# Patient Record
Sex: Male | Born: 1995 | Race: White | Hispanic: No | Smoking: Never smoker
Health system: Southern US, Community
[De-identification: ages and names within clinical notes are randomized; demographics above are authoritative.]

---

## 2014-07-18 ENCOUNTER — Emergency Department: Payer: Self-pay | Admitting: Emergency Medicine

## 2014-08-15 ENCOUNTER — Emergency Department (HOSPITAL_COMMUNITY): Payer: BLUE CROSS/BLUE SHIELD

## 2014-08-15 ENCOUNTER — Encounter (HOSPITAL_COMMUNITY): Payer: Self-pay | Admitting: *Deleted

## 2014-08-15 ENCOUNTER — Emergency Department (HOSPITAL_COMMUNITY)
Admission: EM | Admit: 2014-08-15 | Discharge: 2014-08-16 | Disposition: A | Discharge status: Home / Self Care | Payer: BLUE CROSS/BLUE SHIELD | Attending: Emergency Medicine | Admitting: Emergency Medicine

## 2014-08-15 DIAGNOSIS — N503 Cyst of epididymis: Secondary | ICD-10-CM | POA: Diagnosis not present

## 2014-08-15 DIAGNOSIS — N508 Other specified disorders of male genital organs: Secondary | ICD-10-CM | POA: Diagnosis present

## 2014-08-15 DIAGNOSIS — N50819 Testicular pain, unspecified: Secondary | ICD-10-CM

## 2014-08-15 MED ORDER — IBUPROFEN 400 MG PO TABS
600.0000 mg | ORAL_TABLET | Freq: Once | ORAL | Status: DC
  Filled 2014-08-15 (×2): qty 1

## 2014-08-15 NOTE — ED Notes (Signed)
Pt in c/o right testicle pain, states he has been having intermittent pain for the last week and it will alternate between left and right testicle, today pain is worse on the right and he has noticed redness and swelling

## 2014-08-16 LAB — URINALYSIS, ROUTINE W REFLEX MICROSCOPIC
BILIRUBIN URINE: NEGATIVE
Glucose, UA: NEGATIVE mg/dL
HGB URINE DIPSTICK: NEGATIVE
Ketones, ur: NEGATIVE mg/dL
Leukocytes, UA: NEGATIVE
NITRITE: NEGATIVE
PH: 7.5 (ref 5.0–8.0)
Protein, ur: NEGATIVE mg/dL
SPECIFIC GRAVITY, URINE: 1.008 (ref 1.005–1.030)
Urobilinogen, UA: 1 mg/dL (ref 0.0–1.0)

## 2014-08-16 LAB — GRAM STAIN: GRAM STAIN: NONE SEEN

## 2014-08-16 NOTE — ED Provider Notes (Signed)
CSN: [id number]     Arrival date & time 08/15/14  2142 History   First MD Initiated Contact with Patient 08/15/14 2233     Chief Complaint  Patient presents with  . Testicle Pain     (Consider location/radiation/quality/duration/timing/severity/associated sxs/prior Treatment) HPI   This is a 19 yo male with no pertinent PMH, presenting with testicular pain.  Onset one month ago.  Located bilateral testes, alternate bw left and right.  Intermittent.  Throbbing.  Alleviated but not resolved with APAP.  Radiates up the scrotum bilaterally.  Negative for dysuria, penile pain, urethral discharge, swelling to the penis.  Thinks he might have seen a "blood vessel" weeks ago.  He presented to [facility] a couple of weeks ago, ultrasound was performed, but patient was not told of the results.  History reviewed. No pertinent past medical history. History reviewed. No pertinent past surgical history. History reviewed. No pertinent family history. History  Substance Use Topics  . Smoking status: Never Smoker   . Smokeless tobacco: Not on file  . Alcohol Use: Not on file    Review of Systems  Constitutional: Negative for fever and chills.  HENT: Negative for facial swelling.   Eyes: Negative for pain and visual disturbance.  Respiratory: Negative for chest tightness and shortness of breath.   Cardiovascular: Negative for chest pain.  Gastrointestinal: Negative for nausea and vomiting.  Genitourinary: Positive for testicular pain. Negative for dysuria.  Musculoskeletal: Negative for myalgias and arthralgias.  Neurological: Negative for headaches.  Psychiatric/Behavioral: Negative for behavioral problems.      Allergies  Review of patient's allergies indicates no known allergies.  Home Medications   Prior to Admission medications   Medication Sig Start Date End Date Taking? Authorizing Provider  acetaminophen (TYLENOL) 325 MG tablet Take 650 mg by mouth every 6 (six) hours as needed for  mild pain.   Yes Historical Provider, MD   BP 135/82 mmHg  Pulse 97  Temp(Src) 98.2 F (36.8 C) (Oral)  Resp 13  Ht  (1.753 m)  Wt 150 lb (68.04 kg)  BMI 22.14 kg/m2  SpO2 100% Physical Exam  Constitutional: He is oriented to person, place, and time. He appears well-developed and well-nourished. No distress.  HENT:  Head: Normocephalic and atraumatic.  Mouth/Throat: No oropharyngeal exudate.  Eyes: Conjunctivae are normal. Pupils are equal, round, and reactive to light. No scleral icterus.  Neck: Normal range of motion. No tracheal deviation present. No thyromegaly present.  Cardiovascular: Normal rate, regular rhythm and normal heart sounds.  Exam reveals no gallop and no friction rub.   No murmur heard. Pulmonary/Chest: Effort normal and breath sounds normal. No stridor. No respiratory distress. He has no wheezes. He has no rales. He exhibits no tenderness.  Abdominal: Soft. He exhibits no distension and no mass. There is no tenderness. There is no rebound and no guarding. Hernia confirmed negative in the right inguinal area and confirmed negative in the left inguinal area.  Genitourinary: Testes normal and penis normal. Right testis shows no mass, no swelling and no tenderness. Right testis is descended. Cremasteric reflex is not absent on the right side. Left testis shows no mass, no swelling and no tenderness. Left testis is descended. Cremasteric reflex is not absent on the left side.  Musculoskeletal: Normal range of motion. He exhibits no edema.  Lymphadenopathy:       Right: No inguinal adenopathy present.       Left: No inguinal adenopathy present.  Neurological: He is alert  and oriented to person, place, and time.  Skin: Skin is warm and dry. He is not diaphoretic.    ED Course  Procedures (including critical care time) Labs Review Labs Reviewed  GRAM STAIN  URINALYSIS, ROUTINE W REFLEX MICROSCOPIC    Imaging Review US Scrotum  08/16/2014   CLINICAL DATA:   Bilateral testicle pain  EXAM: SCROTAL ULTRASOUND  DOPPLER ULTRASOUND OF THE TESTICLES  TECHNIQUE: Complete ultrasound examination of the testicles, epididymis, and other scrotal structures was performed. Color and spectral Doppler ultrasound were also utilized to evaluate blood flow to the testicles.  COMPARISON:  07/18/2014  FINDINGS: Right testicle  Measurements: 4.6 x 2.2 x 3 cm. No mass or microlithiasis visualized.  Left testicle  Measurements: 4.7 x 2.4 x 2.5 cm. No mass or microlithiasis visualized.  Right epididymis:  Normal in size and appearance.  Left epididymis: Incidental 3 mm cysts in the head. Normal size and vascularity.  Hydrocele:  None visualized.  Varicocele:  Borderline left varicocele.  Pulsed Doppler interrogation of both testes demonstrates normal low resistance arterial and venous waveforms bilaterally.  IMPRESSION: 1. No torsion or other explanation for acute pain. 2. Small, incidental left epididymal cysts.   Electronically Signed   By: Marnee Spring M.D.   On: 08/16/2014 00:05   [place] Art/ven Flow Abd Pelv Doppler  08/16/2014   CLINICAL DATA:  Bilateral testicle pain  EXAM: SCROTAL ULTRASOUND  DOPPLER ULTRASOUND OF THE TESTICLES  TECHNIQUE: Complete ultrasound examination of the testicles, epididymis, and other scrotal structures was performed. Color and spectral Doppler ultrasound were also utilized to evaluate blood flow to the testicles.  COMPARISON:  07/18/2014  FINDINGS: Right testicle  Measurements: 4.6 x 2.2 x 3 cm. No mass or microlithiasis visualized.  Left testicle  Measurements: 4.7 x 2.4 x 2.5 cm. No mass or microlithiasis visualized.  Right epididymis:  Normal in size and appearance.  Left epididymis: Incidental 3 mm cysts in the head. Normal size and vascularity.  Hydrocele:  None visualized.  Varicocele:  Borderline left varicocele.  Pulsed Doppler interrogation of both testes demonstrates normal low resistance arterial and venous waveforms bilaterally.  IMPRESSION: 1.  No torsion or other explanation for acute pain. 2. Small, incidental left epididymal cysts.   Electronically Signed   By: Marnee Spring M.D.   On: 08/16/2014 00:05     EKG Interpretation None      MDM   Final diagnoses:  Epididymal cyst    This is a 19 yo male with no pertinent PMH, presenting with testicular pain.  Onset one month ago.  Located bilateral testes, alternate bw left and right.  Intermittent.  Throbbing.  Alleviated but not resolved with APAP.  Radiates up the scrotum bilaterally.  Negative for dysuria, penile pain, urethral discharge, swelling to the penis.  Thinks he might have seen a "blood vessel" weeks ago.  He presented to [facility] a couple of weeks ago, ultrasound was performed, but patient was not told of the results.  Vitals WNL.  Abdominal exam reveals no TTP, no rebound, no rigidity, and no guarding.  GU exam reveals normal circumcised penis, normal testes, without erythema, swelling, high-riding testicle, TTP.  Cremasteric is intact bilaterally.  Ultrasound is WNL with the exception of an epididymal cyst.  I do not suspect torsion, epididymitis, orchitis, hydrocele, varicocele.  No trauma is reported.  Pt stable for discharge, FU with urology.  All questions answered.  Return precautions given, particularly if pain becomes acutely worse, testes are swollen, high-riding.  He expresses understanding.  I have discussed case and care has been guided by my attending physician, Dr. Jodi MourningZavitz.     Loma BostonStirling Roshawnda Pecora, MD 08/16/14 1023  Blane OharaJoshua Zavitz, MD 08/18/14 1051

## 2014-08-16 NOTE — Discharge Instructions (Signed)
YOU MAY PURCHASE A SCROTAL SUPPORT, APPLY HEAT AND ICE, AS WELL AS USE IBUPROFEN SCHEDULED.  FOLLOW UP WITH UROLOGY AS MENTIONED IN THE FOLLOW UP SECTION.   Scrotal Masses Scrotal swelling is common in men of all ages. Common types of testicular masses include:   Hydrocele. The most common benign testicular mass in an adult. Hydroceles are generally soft and painless collections of fluid in the scrotal sac. These can rapidly change size as the fluid enters or leaves. Hydroceles can be associated with an underlying cancer of the testicle.  Spermatoceles. Generally soft and painless cyst-like masses in the scrotum that contain fluid, usually above the testicle. They can rapidly change size as the fluid enters or leaves. They are more prominent while standing or exercising. Sometimes, spermatoceles may cause a sensation of heaviness or a dull ache.  Orchitis. Inflammation of the testicle. It is painful and may be associated with a fever or symptoms of a urinary tract infection, including frequent and painful urination. It is common in males who have the mumps.  Varicocele. An enlargement of the veins that drain the testicles. Varicoceles usually occur on the left side of the scrotum. This condition can increase the risk of infertility. Varicocele is sometimes more prominent while standing or exercising. Sometimes, varicoceles may cause a sensation of heaviness or a dull ache.  Inguinal hernia. A bulge caused by a portion of intestine protruding into the scrotum through a weak area in the abdominal muscles. Hernias may or may not be painful. They are soft and usually enlarge with coughing or straining.  Torsion of the testis. This can cause a testicular mass that develops quickly and is associated with tenderness or fever, or both. It is caused by a twisting of the testicle within the sac. It also reduces the blood supply and can destroy the testis if not treated quickly with surgery.  Epididymitis.  Inflammation of the epididymis (a structure attached above and behind the testicle), usually caused by a urinary tract infection or a sexually transmitted infection. This generally shows up as testicular discomfort and swelling and may include pain during urination. It is frequently associated with a testicle infection.  Testicular appendages. Remnants of tissue on the testis present since birth. A testicular appendage can twist on its blood supply and cause pain. In most cases, this is seen as a blue dot on the scrotum.  Hematocele. A collection of blood between the layers of the sac inside the scrotum. It usually is caused by trauma to the scrotum.  Sebaceous cysts. These can be a swelling in the skin of the scrotum and are usually painless.  Cancer (carcinoma) of the skin of the scrotum. It can cause scrotal swelling, but this is rare. Document Released: 10/26/2002 Document Revised: 12/22/2012 Document Reviewed: 10/11/2012 [facility] Patient Information 2015 [employer], [place]. This information is not intended to replace advice given to you by your health care provider. Make sure you discuss any questions you have with your health care provider.

## 2016-01-27 IMAGING — US US SCROTUM W/ DOPPLER COMPLETE
1 series · 14 of 25 positions shown · non-contrast
Comparison: None.

CLINICAL DATA: Acute onset of left testicular pain for 1 day.
Pressure about the left testis. Initial encounter.

EXAM:
SCROTAL ULTRASOUND
DOPPLER ULTRASOUND OF THE TESTICLES
TECHNIQUE: Complete ultrasound examination of the testicles, epididymis, and
other scrotal structures was performed. Color and spectral Doppler
ultrasound were also utilized to evaluate blood flow to the
testicles.

[Series 1: us scrotum w/ doppler complete · 0.06mm/px · 14 of 56 slices shown]
[im 1/56]
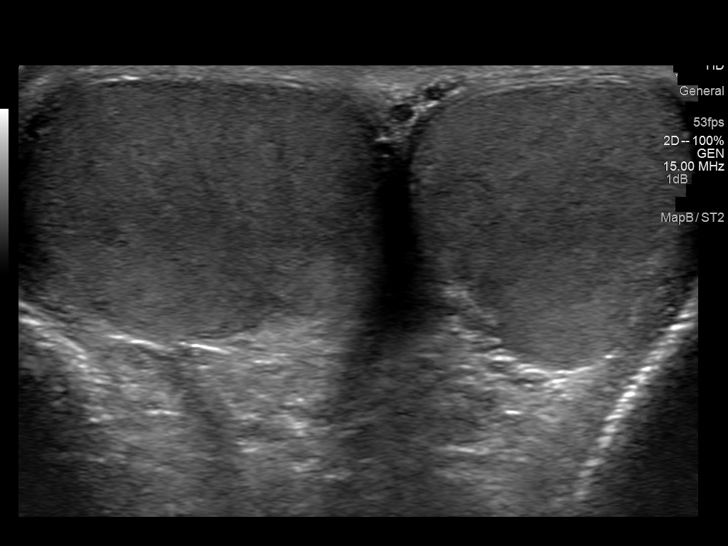
[im 5/56]
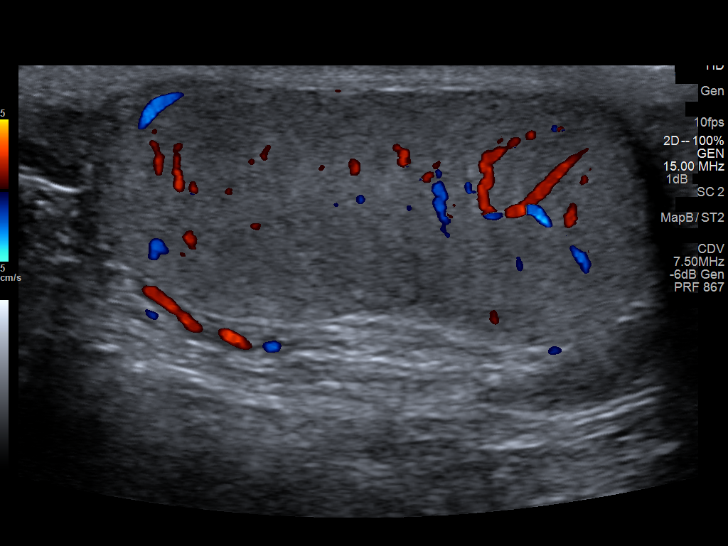
[im 10/56]
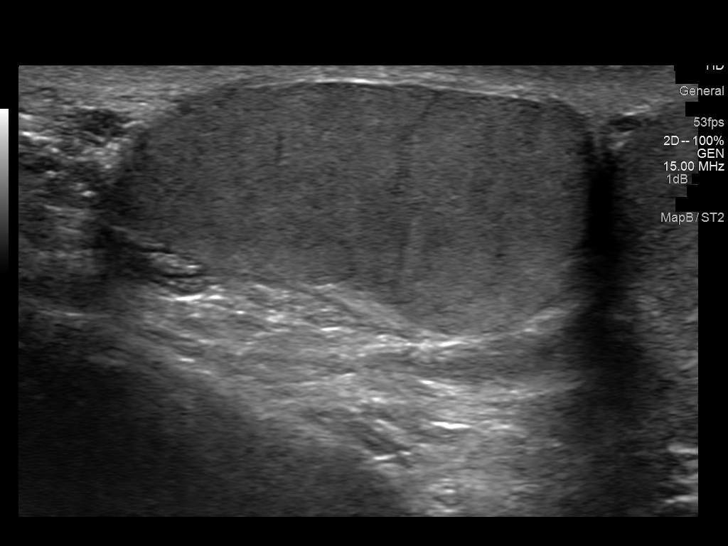
[im 14/56]
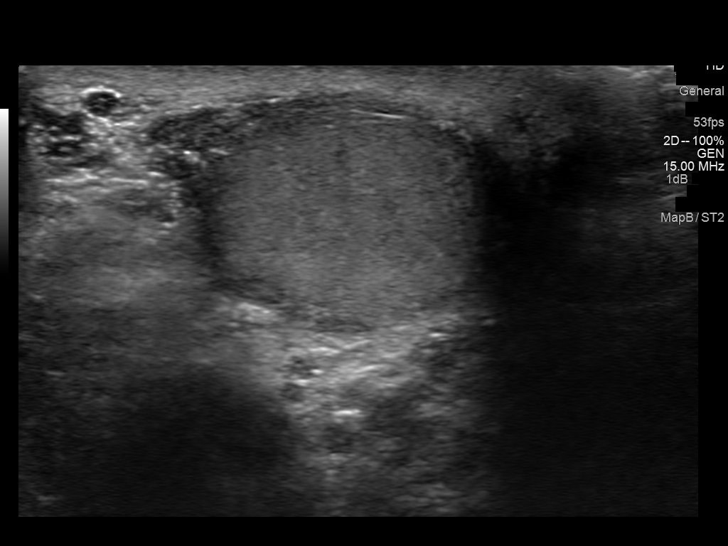
[im 19/56]
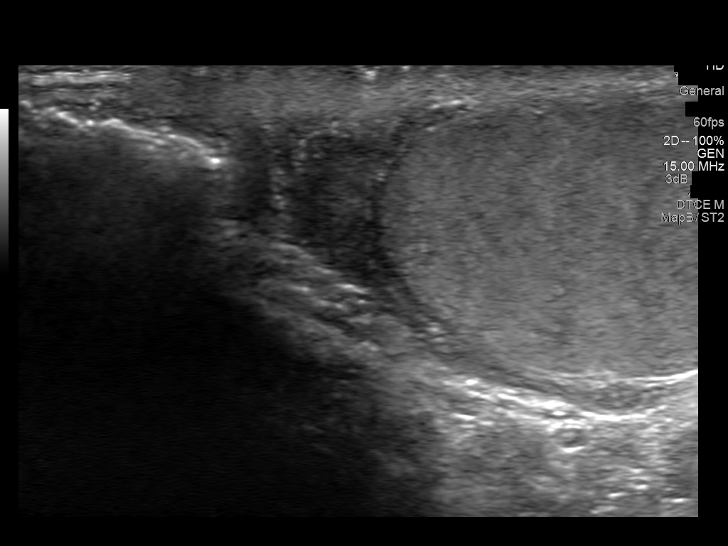
[im 21/56]
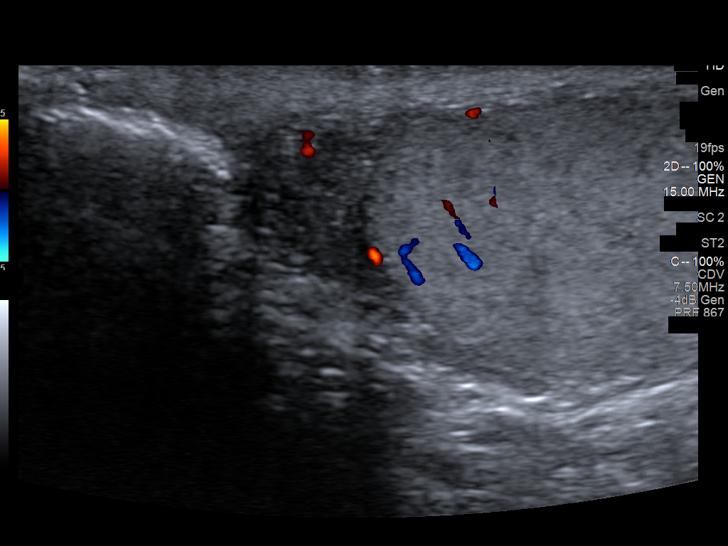
[im 26/56]
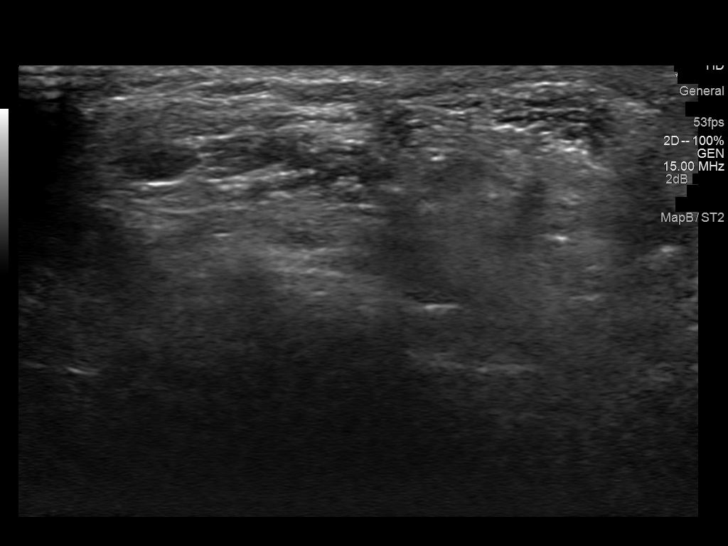
[im 30/56]
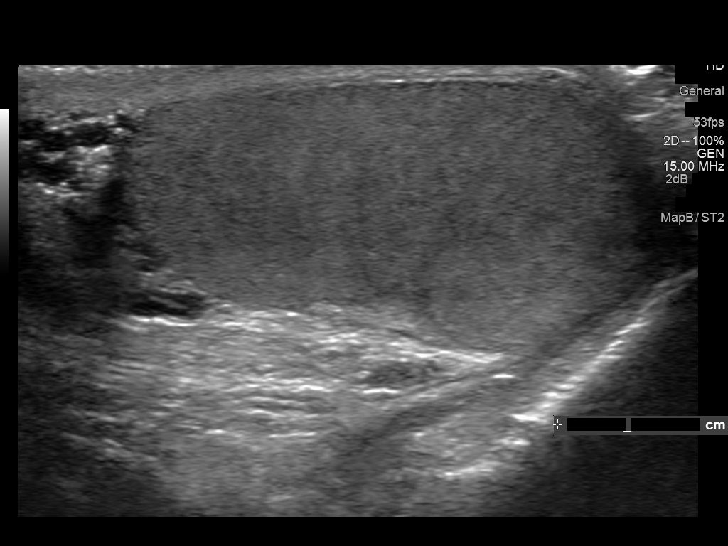
[im 35/56]
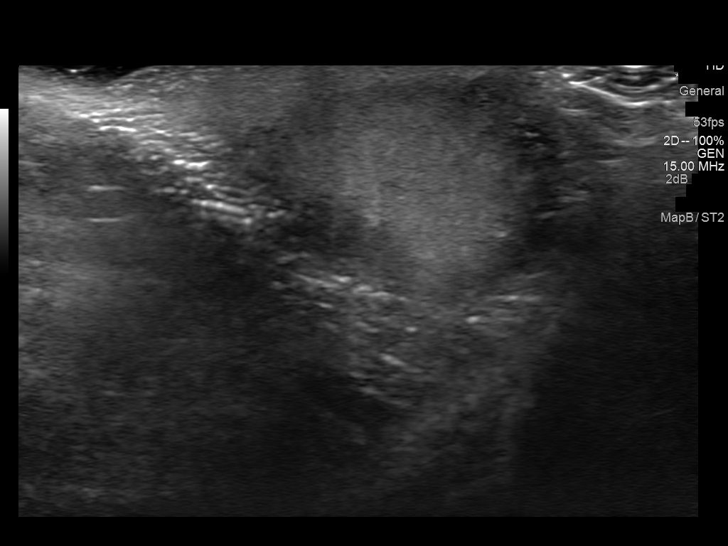
[im 37/56]
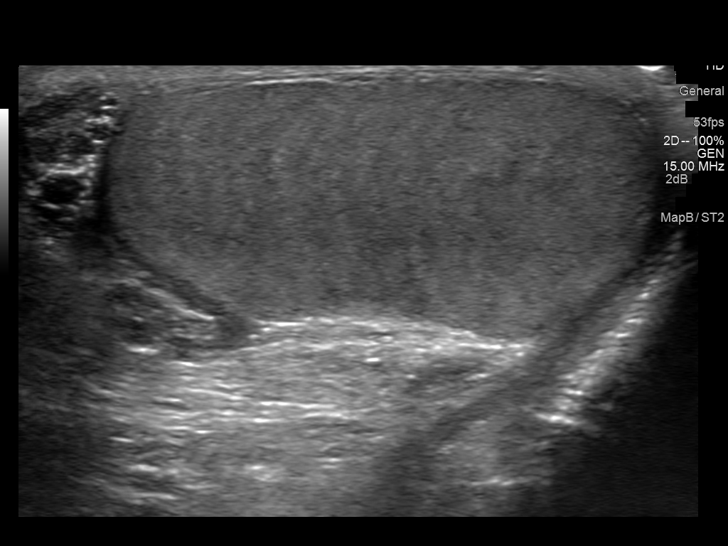
[im 42/56]
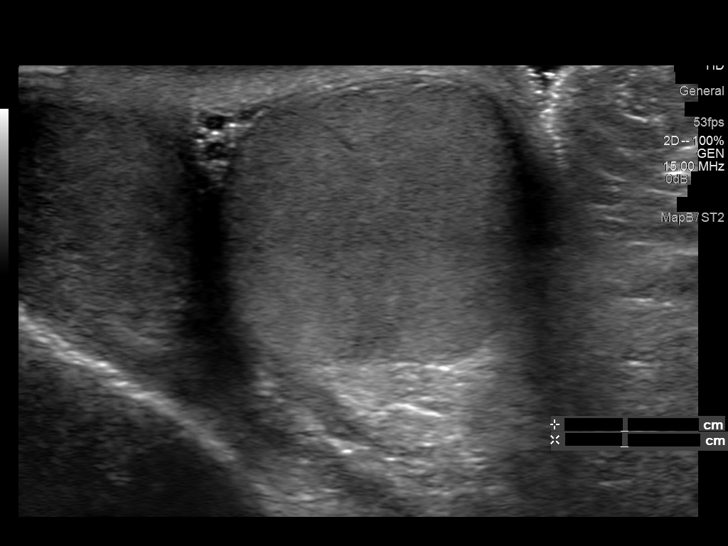
[im 46/56]
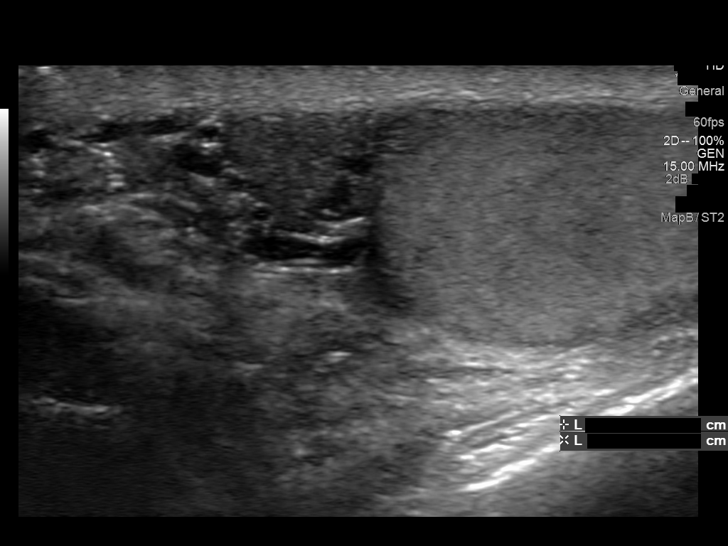
[im 51/56]
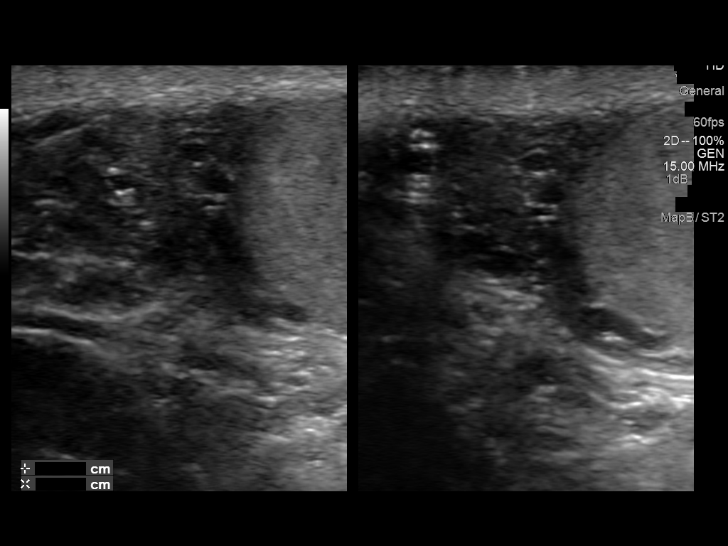
[im 56/56]
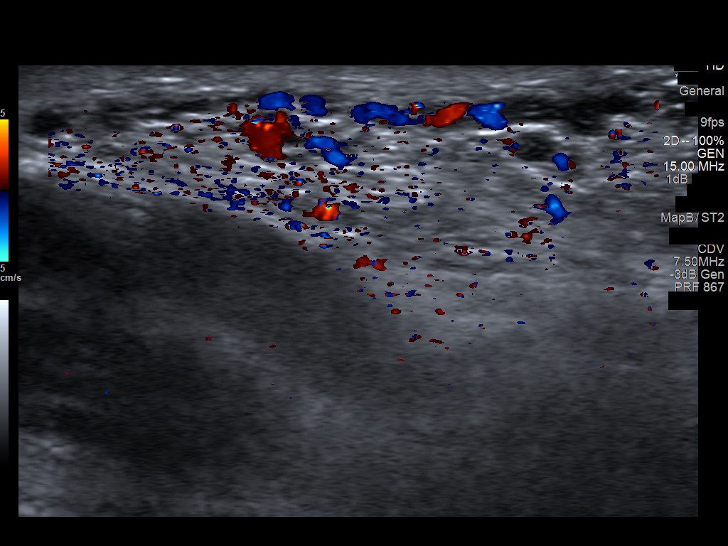

[14 of 25 positions shown; findings below may reference images not displayed]

FINDINGS: Right testicle

Measurements: 4.4 x 2.4 x 2.5 cm. No mass or microlithiasis
visualized.

Left testicle

Measurements: 4.8 x 2.3 x 3.0 cm. No mass or microlithiasis
visualized.

Right epididymis:  Normal in size and appearance.

Left epididymis: A few tiny left-sided epididymal head cysts are
seen, measuring up to 3 mm in size.

Hydrocele:  None visualized.

Varicocele:  None visualized.

Pulsed Doppler interrogation of both testes demonstrates normal low
resistance arterial and venous waveforms bilaterally.
IMPRESSION: 1. No evidence of testicular torsion.
2. Few tiny left epididymal head cysts incidentally noted.

## 2016-03-14 ENCOUNTER — Emergency Department
Admission: EM | Admit: 2016-03-14 | Discharge: 2016-03-15 | Disposition: A | Discharge status: Home / Self Care | Payer: BLUE CROSS/BLUE SHIELD | Attending: Emergency Medicine | Admitting: Emergency Medicine

## 2016-03-14 ENCOUNTER — Emergency Department: Payer: BLUE CROSS/BLUE SHIELD

## 2016-03-14 DIAGNOSIS — Z79899 Other long term (current) drug therapy: Secondary | ICD-10-CM | POA: Insufficient documentation

## 2016-03-14 DIAGNOSIS — N50812 Left testicular pain: Secondary | ICD-10-CM | POA: Diagnosis present

## 2016-03-14 DIAGNOSIS — N50819 Testicular pain, unspecified: Secondary | ICD-10-CM

## 2016-03-14 DIAGNOSIS — I861 Scrotal varices: Secondary | ICD-10-CM | POA: Insufficient documentation

## 2016-03-14 MED ORDER — HYDROMORPHONE HCL 1 MG/ML IJ SOLN
INTRAMUSCULAR | Status: AC
Start: 2016-03-14 — End: 2016-03-14
  Administered 2016-03-14: 1 mg via INTRAVENOUS
  Filled 2016-03-14: qty 1

## 2016-03-14 MED ORDER — HYDROMORPHONE HCL 1 MG/ML IJ SOLN
1.0000 mg | Freq: Once | INTRAMUSCULAR | Status: AC
  Administered 2016-03-14: 1 mg via INTRAVENOUS
  Filled 2016-03-14: qty 1

## 2016-03-14 MED ORDER — ONDANSETRON HCL 4 MG/2ML IJ SOLN
4.0000 mg | Freq: Once | INTRAMUSCULAR | Status: AC
  Administered 2016-03-14: 4 mg via INTRAVENOUS
  Filled 2016-03-14: qty 2

## 2016-03-14 MED ORDER — HYDROMORPHONE HCL 1 MG/ML IJ SOLN
1.0000 mg | Freq: Once | INTRAMUSCULAR | Status: AC
Start: 2016-03-14 — End: 2016-03-14
  Administered 2016-03-14: 1 mg via INTRAVENOUS

## 2016-03-14 MED ORDER — SODIUM CHLORIDE 0.9 % IV BOLUS (SEPSIS)
1000.0000 mL | Freq: Once | INTRAVENOUS | Status: AC
  Administered 2016-03-14: 1000 mL via INTRAVENOUS

## 2016-03-14 NOTE — ED Triage Notes (Signed)
Patient presents to the ED with bilateral testicular pain that has worsened throughout the day. Patient HYPERventilating; reports hand and body numbness.

## 2016-03-14 NOTE — ED Provider Notes (Signed)
[facility] Provider Note  Time seen: 10:36 PM  I have reviewed the triage vital signs and the nursing notes.   HISTORY  Chief Complaint Testicle Pain    HPI Bobby Ryan is a 20 y.o. male with a past medical history of anxiety who presents the emergency department with left-sided testicular pain. According to the patient for the past 2 years she has had intermittent left testicular pain. He states several hours ago significant worsening of the pain, states this is the worst pain he has ever experienced. Feels nauseated. Denies any abdominal pain, dysuria, or fever. Denies any trauma to the groin.  No past medical history on file.  There are no active problems to display for this patient.   No past surgical history on file.  Prior to Admission medications   Medication Sig Start Date End Date Taking? Authorizing Provider  acetaminophen (TYLENOL) 325 MG tablet Take 650 mg by mouth every 6 (six) hours as needed for mild pain.    Historical Provider, MD    No Known Allergies  No family history on file.  Social History Social History  Substance Use Topics  . Smoking status: Never Smoker  . Smokeless tobacco: Not on file  . Alcohol use Not on file    Review of Systems Constitutional: Negative for fever. Cardiovascular: Negative for chest pain. Respiratory: Negative for shortness of breath. Gastrointestinal: Negative for abdominal pain Genitourinary: Left testicular pain Neurological: Negative for headache. 10-point ROS otherwise negative.  ____________________________________________   PHYSICAL EXAM:  VITAL SIGNS: ED Triage Vitals [03/14/16 2229]  Enc Vitals Group     BP (!) 139/96     Pulse Rate (!) 156     Resp (!) 30     Temp 97.6 F (36.4 C)     Temp Source Oral     SpO2 99 %     Weight      Height      Head Circumference      Peak Flow      Pain Score 10     Pain Loc      Pain Edu?      Excl. in  GC?     Constitutional: Alert and oriented. Mild distress due to pain. Eyes: Normal exam ENT   Head: Normocephalic and atraumatic Cardiovascular: Normal rate, regular rhythm. No murmur Respiratory: Normal respiratory effort without tachypnea nor retractions. Breath sounds are clear Gastrointestinal: Soft and nontender. No distention.   Genitourinary: Significant tenderness to palpation of left testicle, mild edema left testicle, no skin color changes. Normal penile exam. No discharge. Right testicle is normal, nontender Musculoskeletal: Nontender with normal range of motion in all extremities.  Neurologic:  Normal speech and language. No gross focal neurologic deficits  Skin:  Skin is warm, dry and intact.  Psychiatric: Mood and affect are normal.  ____________________________________________    RADIOLOGY  Ultrasound pending  ____________________________________________   INITIAL IMPRESSION / ASSESSMENT AND PLAN / ED COURSE  Pertinent labs & imaging results that were available during my care of the patient were reviewed by me and considered in my medical decision making (see chart for details).  The patient presents to the emergency department with left testicular pain. Patient states he has had intermittent left testicular pain of the past 2 years but acutely worse several hours ago associated with nausea. Patient does have significant tenderness palpation of the left testicle. No skin color changes. Possible mild edema. We'll obtain an ultrasound, treat  pain and nausea, IV hydrate while awaiting results.  Ultrasound pending, patient care signed out to Dr. Manson PasseyBrown.  ____________________________________________   FINAL CLINICAL IMPRESSION(S) / ED DIAGNOSES  Left testicular pain    Minna AntisKevin Jacqulynn Shappell, MD 03/14/16 2303

## 2016-03-15 MED ORDER — IBUPROFEN 800 MG PO TABS: 800.0000 mg | ORAL_TABLET | Freq: Three times a day (TID) | ORAL | 0 refills | Status: AC | PRN

## 2016-03-15 MED ORDER — KETOROLAC TROMETHAMINE 30 MG/ML IJ SOLN
30.0000 mg | Freq: Once | INTRAMUSCULAR | Status: AC
  Administered 2016-03-15: 30 mg via INTRAVENOUS
  Filled 2016-03-15: qty 1

## 2016-03-15 NOTE — ED Notes (Signed)
Pt discharged to home.  Family member driving.  Discharge instructions reviewed.  Verbalized understanding.  No questions or concerns at this time.  Teach back verified.  Pt in NAD.  No items left in ED.   

## 2016-03-15 NOTE — ED Provider Notes (Signed)
I assumed care from Dr.Paduchowski at 11:00 PM ultrasound of the testicle revealed: CLINICAL DATA:  Bilateral testicular pain, left greater than right, worsening throughout the day.  EXAM: SCROTAL ULTRASOUND  DOPPLER ULTRASOUND OF THE TESTICLES  TECHNIQUE: Complete ultrasound examination of the testicles, epididymis, and other scrotal structures was performed. Color and spectral Doppler ultrasound were also utilized to evaluate blood flow to the testicles.  COMPARISON:  None.  08/15/2014  FINDINGS: Right testicle  Measurements: 4.6 x 2.4 x 2.6 cm. Homogeneous parenchymal appearance. No mass lesion identified. Single tiny focal calcification noted. Likely of no clinical significance.  Left testicle  Measurements: 4.6 x 2.6 x 2.8 cm. Homogeneous parenchymal appearance. No mass lesion identified.  Right epididymis:  Normal in size and appearance.  Left epididymis:  Normal in size and appearance.  Hydrocele:  None visualized.  Varicocele:  Small left varicocele demonstrated.  Pulsed Doppler interrogation of both testes demonstrates normal low resistance arterial and venous waveforms bilaterally. Normal homogeneous flow to both testicles on color flow Doppler imaging.  IMPRESSION: Normal appearance of the testicles. No evidence of mass, torsion, or inflammation. Small left varicocele.   Electronically Signed   By: Burman NievesWilliam  Stevens M.D.   On: 03/15/2016 00:09 Patient will be referred to Dr. Apolinar JunesBrandon urologist.   Darci Currentandolph N Bretton Tandy, MD 03/15/16 [phone]

## 2018-10-04 IMAGING — US US ART/VEN ABD/PELV/SCROTUM DOPPLER LTD
1 series · 13 of 25 positions shown · non-contrast
Comparison: None.

08/15/2014

CLINICAL DATA: Bilateral testicular pain, left greater than right,
worsening throughout the day.

EXAM:
SCROTAL ULTRASOUND
DOPPLER ULTRASOUND OF THE TESTICLES
TECHNIQUE: Complete ultrasound examination of the testicles, epididymis, and
other scrotal structures was performed. Color and spectral Doppler
ultrasound were also utilized to evaluate blood flow to the
testicles.

[Series 1: us art/ven abd/pelv/scrotum doppler ltd · 0.11mm/px · 13 of 57 slices shown]
[im 1/57]
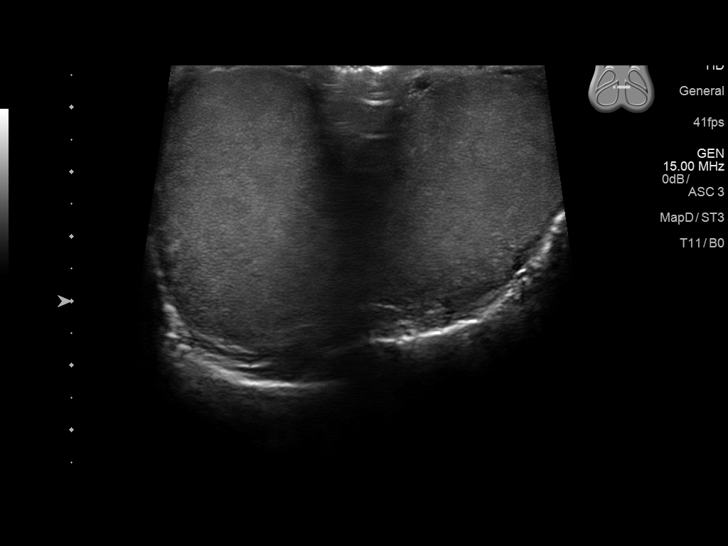
[im 5/57]
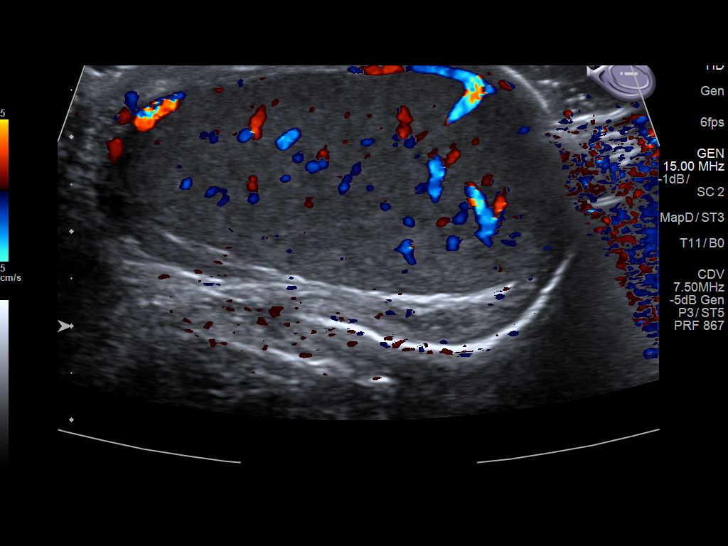
[im 10/57]
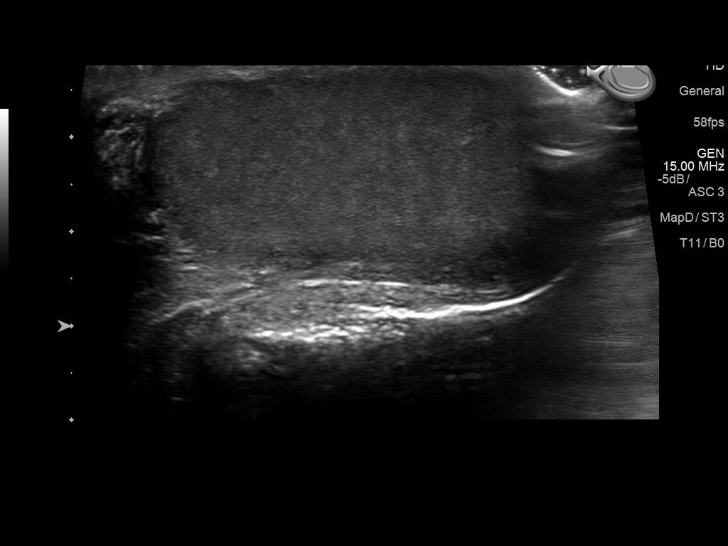
[im 15/57]
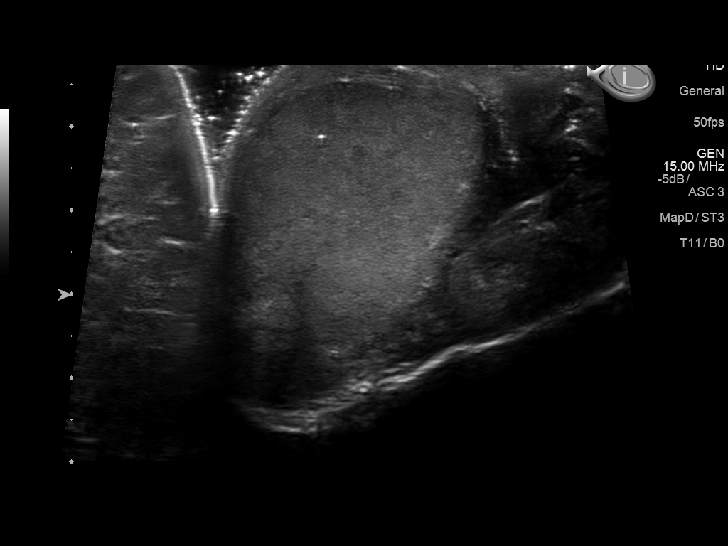
[im 19/57]
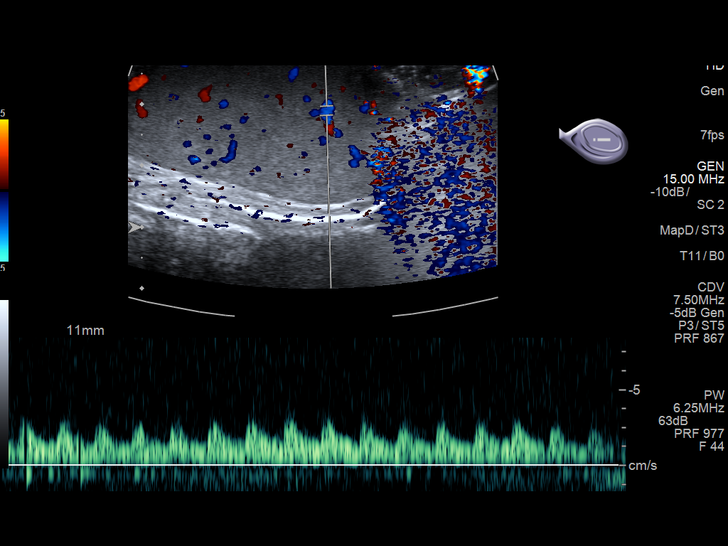
[im 24/57]
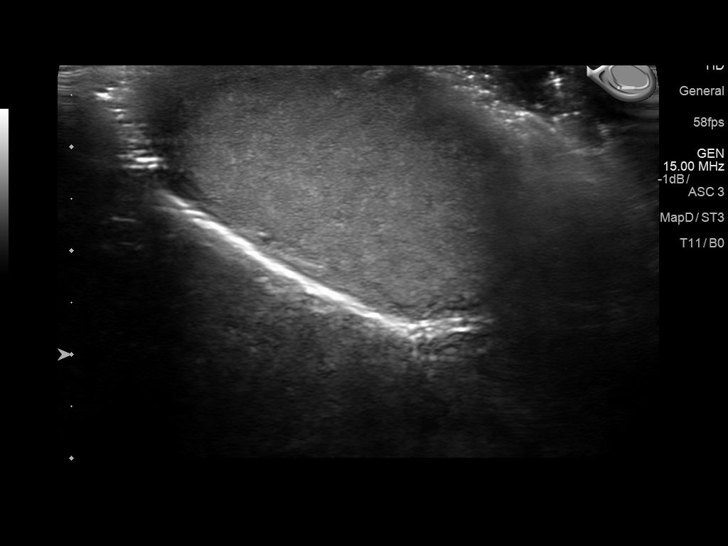
[im 29/57]
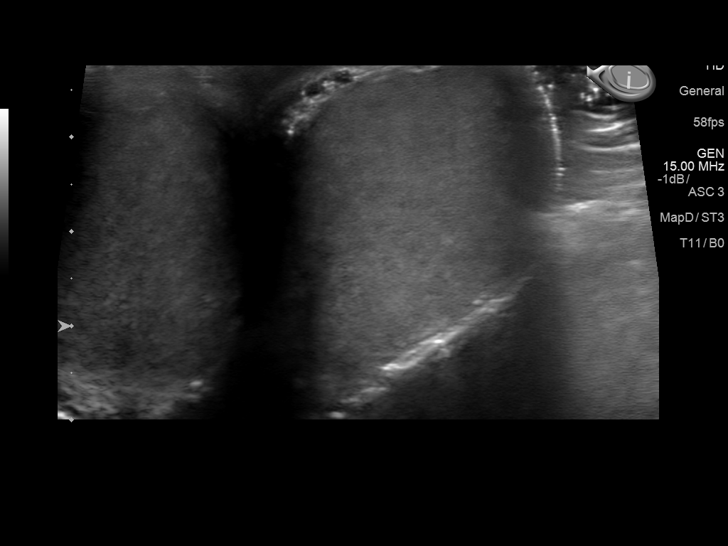
[im 33/57]
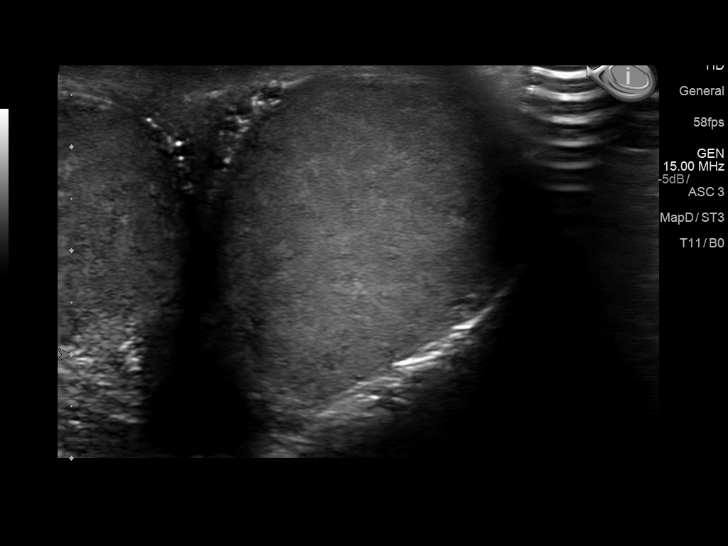
[im 38/57]
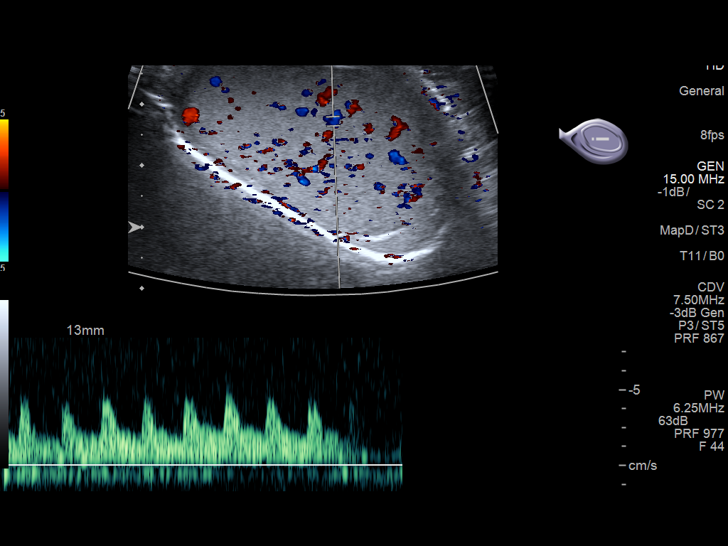
[im 43/57]
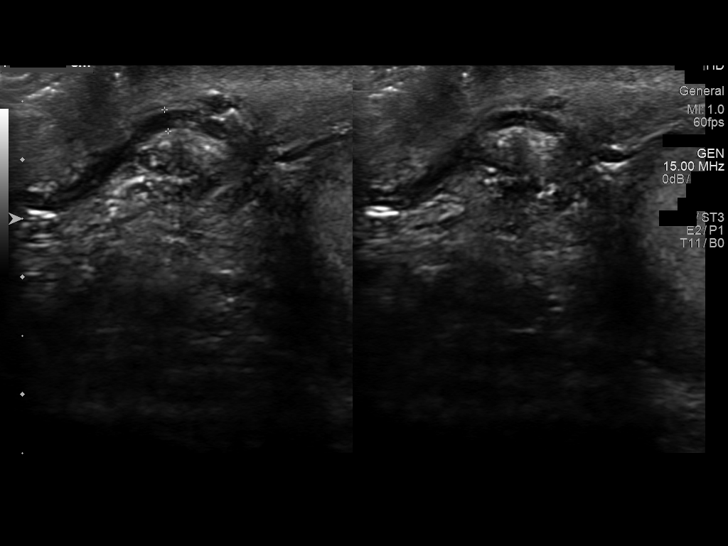
[im 47/57]
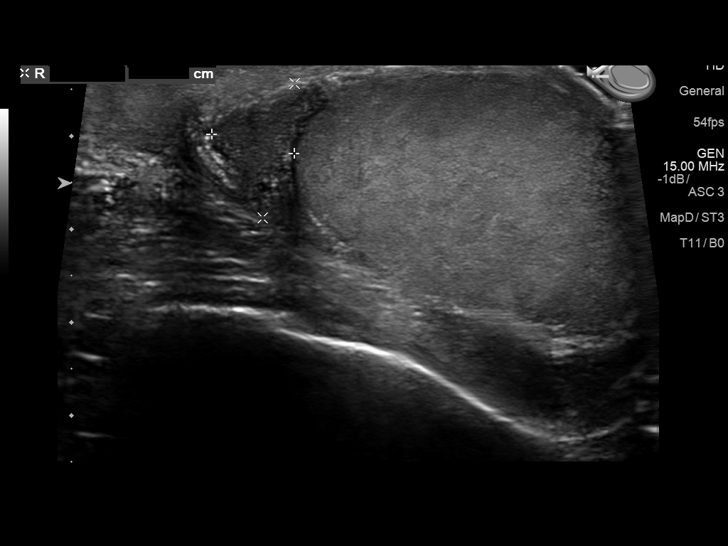
[im 52/57]
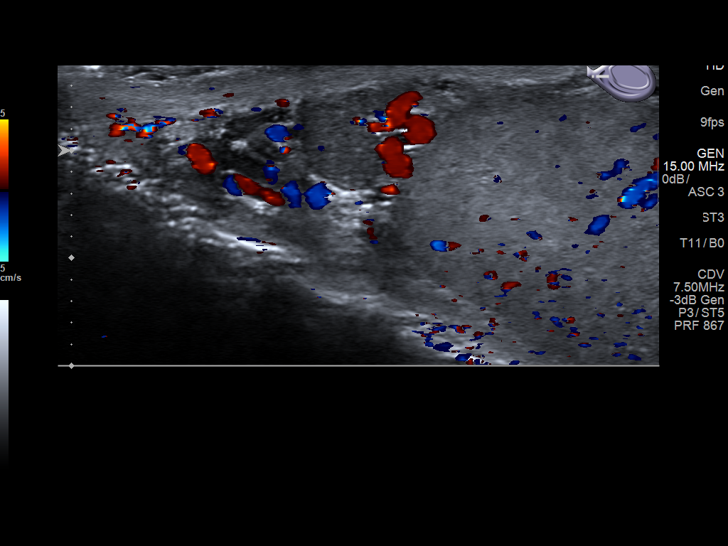
[im 57/57]
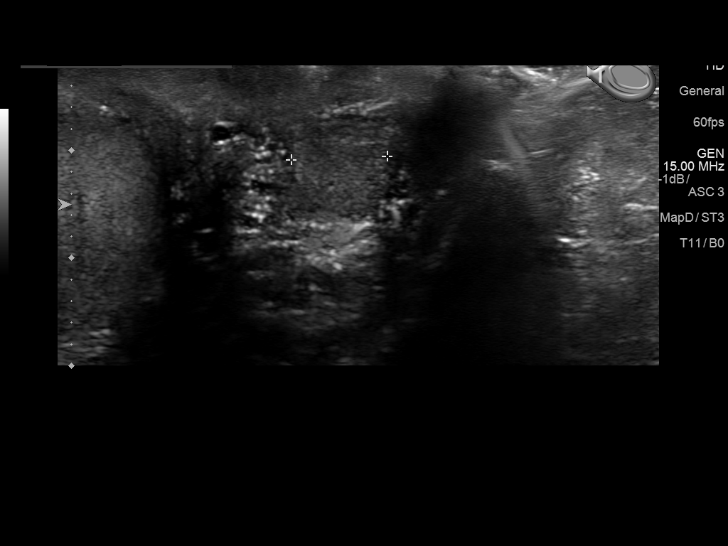

[13 of 25 positions shown; findings below may reference images not displayed]

FINDINGS: Right testicle

Measurements: 4.6 x 2.4 x 2.6 cm. Homogeneous parenchymal
appearance. No mass lesion identified. Single tiny focal
calcification noted. Likely of no clinical significance.

Left testicle

Measurements: 4.6 x 2.6 x 2.8 cm. Homogeneous parenchymal
appearance. No mass lesion identified.

Right epididymis:  Normal in size and appearance.

Left epididymis:  Normal in size and appearance.

Hydrocele:  None visualized.

Varicocele:  Small left varicocele demonstrated.

Pulsed Doppler interrogation of both testes demonstrates normal low
resistance arterial and venous waveforms bilaterally. Normal
homogeneous flow to both testicles on color flow Doppler imaging.
IMPRESSION: Normal appearance of the testicles. No evidence of mass, torsion, or
inflammation. Small left varicocele.
